# Patient Record
Sex: Female | Born: 1945 | Race: White | Hispanic: No | Marital: Married | Smoking: Never smoker
Health system: Southern US, Community
[De-identification: ages and names within clinical notes are randomized; demographics above are authoritative.]

## PROBLEM LIST (undated history)

## (undated) DIAGNOSIS — K219 Gastro-esophageal reflux disease without esophagitis: Secondary | ICD-10-CM

## (undated) DIAGNOSIS — R32 Unspecified urinary incontinence: Secondary | ICD-10-CM

## (undated) DIAGNOSIS — I10 Essential (primary) hypertension: Secondary | ICD-10-CM

## (undated) DIAGNOSIS — M1990 Unspecified osteoarthritis, unspecified site: Secondary | ICD-10-CM

## (undated) DIAGNOSIS — E785 Hyperlipidemia, unspecified: Secondary | ICD-10-CM

## (undated) DIAGNOSIS — A809 Acute poliomyelitis, unspecified: Secondary | ICD-10-CM

## (undated) HISTORY — PX: ABDOMINAL HYSTERECTOMY: SHX81

## (undated) HISTORY — PX: TONSILLECTOMY: SUR1361

---
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
## 2009-01-26 ENCOUNTER — Ambulatory Visit: Payer: Self-pay | Admitting: Family Medicine

## 2010-02-16 ENCOUNTER — Ambulatory Visit: Payer: Self-pay | Admitting: Surgery

## 2010-03-22 ENCOUNTER — Ambulatory Visit: Payer: Self-pay | Admitting: Gastroenterology

## 2011-03-21 ENCOUNTER — Ambulatory Visit: Payer: Self-pay | Admitting: Obstetrics and Gynecology

## 2011-06-27 HISTORY — PX: JOINT REPLACEMENT: SHX530

## 2012-01-15 ENCOUNTER — Ambulatory Visit: Payer: Self-pay | Admitting: Orthopedic Surgery

## 2012-01-15 DIAGNOSIS — I10 Essential (primary) hypertension: Secondary | ICD-10-CM

## 2012-01-15 LAB — CBC
HCT: 40 % (ref 35.0–47.0)
HGB: 12.8 g/dL (ref 12.0–16.0)
MCHC: 31.9 g/dL — ABNORMAL LOW (ref 32.0–36.0)
Platelet: 229 10*3/uL (ref 150–440)
RBC: 4.5 10*6/uL (ref 3.80–5.20)
RDW: 13.5 % (ref 11.5–14.5)

## 2012-01-15 LAB — BASIC METABOLIC PANEL
Anion Gap: 5 — ABNORMAL LOW (ref 7–16)
BUN: 17 mg/dL (ref 7–18)
Calcium, Total: 8.9 mg/dL (ref 8.5–10.1)
Co2: 29 mmol/L (ref 21–32)
Creatinine: 0.98 mg/dL (ref 0.60–1.30)
Glucose: 106 mg/dL — ABNORMAL HIGH (ref 65–99)
Osmolality: 280 (ref 275–301)
Potassium: 4.4 mmol/L (ref 3.5–5.1)
Sodium: 139 mmol/L (ref 136–145)

## 2012-01-15 LAB — SEDIMENTATION RATE: Erythrocyte Sed Rate: 17 mm/hr (ref 0–30)

## 2012-01-15 LAB — APTT: Activated PTT: 30 secs (ref 23.6–35.9)

## 2012-01-15 LAB — MRSA PCR SCREENING

## 2012-01-30 ENCOUNTER — Inpatient Hospital Stay: Payer: Self-pay | Admitting: Orthopedic Surgery

## 2012-01-31 LAB — BASIC METABOLIC PANEL
BUN: 5 mg/dL — ABNORMAL LOW (ref 7–18)
Calcium, Total: 8 mg/dL — ABNORMAL LOW (ref 8.5–10.1)
Chloride: 103 mmol/L (ref 98–107)
EGFR (Non-African Amer.): >60
Glucose: 145 mg/dL — ABNORMAL HIGH (ref 65–99)
Osmolality: 276 (ref 275–301)
Potassium: 3.9 mmol/L (ref 3.5–5.1)
Sodium: 138 mmol/L (ref 136–145)

## 2012-01-31 LAB — HEMOGLOBIN: HGB: 11 g/dL — ABNORMAL LOW (ref 12.0–16.0)

## 2012-02-01 LAB — HEMOGLOBIN: HGB: 10.5 g/dL — ABNORMAL LOW (ref 12.0–16.0)

## 2012-04-23 ENCOUNTER — Ambulatory Visit: Payer: Self-pay | Admitting: Family Medicine

## 2013-08-13 ENCOUNTER — Ambulatory Visit: Payer: Self-pay | Admitting: Family Medicine

## 2014-09-08 ENCOUNTER — Ambulatory Visit: Payer: Self-pay | Admitting: Family Medicine

## 2014-09-17 ENCOUNTER — Ambulatory Visit: Payer: Self-pay | Admitting: Family Medicine

## 2014-10-13 NOTE — Discharge Summary (Signed)
PATIENT NAME:  Lindsay Donaldson, Lindsay Donaldson MR#:  [id number] DATE OF BIRTH:  05-25-1946  DATE OF ADMISSION:  01/30/2012 DATE OF DISCHARGE:  02/02/2012   ADMITTING DIAGNOSIS: Left hip osteoarthritis.   DISCHARGE DIAGNOSIS: Left hip osteoarthritis.   PROCEDURE: Left total hip replacement, anterior approach.   SURGEON: Leitha Schuller, MD    ANESTHESIA: Spinal.   ESTIMATED BLOOD LOSS: 320 with 125 reinfused from cell saver.   COMPLICATIONS: None.   SPECIMENS: Femoral head.   IMPLANTS: Medacta Versa fit cup DM size 52 liner with a size M 28 mm head Versa fit cup, 52 mm head with a 28 mm internal diameter for the femoral head component and a size 1 standard Amis stem.   HISTORY: The patient is a 69 year old female who had left anterior groin hip pain over the last year. It has progressively gotten worse and has limited her activities of daily living. The patient has tried anti-inflammatory medications as well as pain medications with no relief. Pain has significantly gotten worse. She is having difficulty walking.   PHYSICAL EXAMINATION: GENERAL: Well developed, well nourished female in no apparent distress. Normal affect. Normal gait. Mild antalgic component left lower extremity. She does have a foot drop. HEENT: Head is normocephalic, atraumatic. Pupils equal, round, and reactive to light. HEART: Regular rate and rhythm. There is no murmur. There is normal apical pulse. LUNGS: Clear to auscultation. There is no wheeze, rhonchi, or crackles. There is normal expansion of bilateral chest walls. LEFT LOWER EXTREMITY: Examination of left lower extremity reveals no bony abnormality. No edema. No ecchymosis. The patient has 10 degrees internal rotation with significant pain and 20 degrees of external rotation with pain. The patient is nontender in the lateral greater trochanter region. The patient has anterior hip joint discomfort with internal and external rotation as well as flexion. NEUROLOGICAL: The patient has  a negative straight leg raise. She has 5 out of 5 muscle strength with hip flexion, extension, knee extension, knee flexion. The patient has sensation that is intact to light touch. VASCULAR: The patient has less than 2 second capillary refill. She has intact dorsalis pedis and posterior tibialis pulses.   HOSPITAL COURSE: The patient was admitted to the hospital on 01/30/2012. She had surgery that same day and she was brought to the orthopedic floor from the PAC-U in stable condition. The patient's blood work was monitored and on 01/31/2012 her hemoglobin was 11 and then on 02/01/2012 it had only dropped to 10.5. Throughout the patient's stay she had progressed well with physical therapy, her vital signs remained stable, and pain was under control. By 02/02/2012 the patient was stable and ready for discharge to home with home health.   DISCHARGE INSTRUCTIONS:  1. The patient may gradually increase weightbearing on the affected extremity.  2. Knee-high TED hose on both legs and remove at bedtime and replace on arising the next morning.  3. She may resume a regular diet. 4. Xarelto 10 mg orally once a day in the morning for 32 days and then oxycodone 5 to 10 mg every four hours as needed for pain.  5. She will apply ice to the affected area. Do not get the dressing or bandage wet or dirty. She needs to call [facility] if the dressing gets water under it. She needs to call [facility] also if any bright red bleeding from the incision wound or fever above 101.5 degrees, redness, swelling, or if drainage at the incision site occurs.  6.  She is referred to home physical therapy and has been arranged for continuation of the rehab program. [facility] if therapist has not contacted her within 48 hours.  7. She needs a follow-up appointment in two weeks. [facility] to schedule a an appointment.   DISCHARGE  MEDICATIONS:  1. Crestor 10 mg 1 tablet orally once a day at bedtime.  2. Multivitamin 1 tablet once in the morning.  3. MiraLAX 1 capful add  water daily as needed. 4. Calcium Citrate Plus D 1 tablet orally t.i.d.  5. Xyzal 5 mg oral tablet 1 tablet once a day in the evening. 6. Hydrochlorothiazide/triamterene 25 mg/37.5 mg oral capsule 0.5 capsule orally once a day in the morning.  7. Latanoprost 0.005% ophthalmic solution one drop to each affected eye once a day at bedtime.  8. Meloxicam 15 mg oral tablet 1 tablet orally once in the morning.  9. Tramadol 50 mg oral tablet 1 tab q.i.d. orally p.r.n.  10. Nexium 40 mg 1 tablet orally once a day in the morning. 11. Nifedipine 30 mg oral tablet extended-release 1 tablet orally once a day in the morning.   ____________________________ Evon Slackhomas C. Mykel Sponaugle, PA-C tcg:drc D: 02/02/2012 06:34:07 ET T: 02/02/2012 12:12:58 ET JOB#: [id number]  cc: Evon Slackhomas C. Hermina Barnard, PA-C, <Dictator> Evon SlackHOMAS C Rahul Malinak GeorgiaPA ELECTRONICALLY SIGNED 02/07/2012 12:04

## 2014-10-13 NOTE — Op Note (Signed)
PATIENT NAME:  Lindsay Donaldson, Lindsay Donaldson MR#:  [id number] DATE OF BIRTH:  September 13, 1945  DATE OF PROCEDURE:  01/30/2012  PREOPERATIVE DIAGNOSIS: Left hip osteoarthritis.   POSTOPERATIVE DIAGNOSIS: Left hip osteoarthritis.   PROCEDURE: Left total hip replacement, anterior approach.   SURGEON: Leitha SchullerMichael J. Reco Shonk, MD  ANESTHESIA: Spinal.   DESCRIPTION OF PROCEDURE: Patient brought to the Operating Room and after adequate spinal anesthesia was obtained the patient was placed on the operative table with the right leg in a well legholder and left leg in the Medacta table attachment with the traction boot applied. After appropriate positioning C-arm was brought in and good visualization of the proximal femur could be obtained and preprocedure picture printed for intraoperative templating. The hip was then prepped and draped using standard technique and after appropriate patient identification and timeout procedures were carried out anterior hip approach was performed first with a skin incision centered over the greater trochanter running obliquely laterally as it went distal getting down to the tensor fascia muscle. The fascia overlying this was incised and going within this muscle compartment the muscle was retracted laterally and Adson-Beckman retractor inserted. The deep fascia was incised and the quadriceps fascia was then identified and incised. The quadriceps muscle retracted medially and in this interval the circumflex femoral branches artery and vein were identified and tied off with minimal bleeding encountered. Next, the anterior capsule was well visualized and exposed and anterior capsulotomy carried out. The femoral neck cut was made with assistance of the C-arm to assess the level. After the femoral neck cut was made the head was removed with use of a corkscrew. There was eburnated bone on the superior aspect of the femoral head with significant wear. The acetabulum was freed of debris and labrum. The 50 mm was  used initially followed by the 52 and this seemed to give good fit to the cup with exposed bone in the center and periphery of the acetabulum. The trial cup was tight with the pullout test and the final component was then impacted, appeared to be in appropriate position. The leg was then dropped down into extension with external rotation and some adduction. A portion of the capsule was released to allow for this. The femur was approached with a box osteotome then the starter reamer then sequential rasping. The #1 seemed to give a tight fit and with trialing this gave good appropriate position with x-ray being taken and printed and comparing to the initial image. The final components were then taken and inserted with the #1 femoral stem followed by a medium 28 mm head with dual mobility 52 mm liner. The hip was then reduced without difficulty. The hip appeared stable. The wound was thoroughly irrigated and then injected with a combination of Toradol, morphine and local anesthetic, 0.25% this Sensorcaine with epinephrine. The wound was then closed with running heavy quill suture for the fascia lata, 2-0 quill with subcutaneous drain, and skin staples. 4 x 4's, ABD and tape applied and the patient sent to recovery room in stable condition.   ESTIMATED BLOOD LOSS: 325 with 125 reinfused from Cell Saver.   COMPLICATIONS: None.   SPECIMENS: Femoral head.   IMPLANTS: Medacta Versafit cup DM size 52 liner with a size M 28 mm head Versafit cup, 52 mm head with a 28 mm internal diameter for the femoral head component and a size 1 standard Amis stem.   ____________________________ Leitha SchullerMichael J. Zakiah Beckerman, MD mjm:cms D: 01/30/2012 17:12:33 ET T: 01/31/2012 09:17:22 ET JOB#: [id number]  cc: Leitha Schuller, MD, <Dictator> Nolon Bussing [facility] MD ELECTRONICALLY SIGNED 01/31/2012 12:47

## 2015-10-28 ENCOUNTER — Other Ambulatory Visit: Payer: Self-pay | Admitting: Family Medicine

## 2015-10-28 DIAGNOSIS — Z1239 Encounter for other screening for malignant neoplasm of breast: Secondary | ICD-10-CM

## 2015-12-02 ENCOUNTER — Ambulatory Visit: Payer: Self-pay

## 2015-12-14 ENCOUNTER — Ambulatory Visit
Admission: RE | Admit: 2015-12-14 | Discharge: 2015-12-14 | Disposition: A | Discharge status: Home / Self Care | Payer: Medicare Other | Source: Ambulatory Visit | Attending: Family Medicine | Admitting: Family Medicine

## 2015-12-14 ENCOUNTER — Other Ambulatory Visit: Payer: Self-pay | Admitting: Family Medicine

## 2015-12-14 DIAGNOSIS — Z1239 Encounter for other screening for malignant neoplasm of breast: Secondary | ICD-10-CM

## 2015-12-14 DIAGNOSIS — Z1231 Encounter for screening mammogram for malignant neoplasm of breast: Secondary | ICD-10-CM | POA: Insufficient documentation

## 2016-11-01 ENCOUNTER — Other Ambulatory Visit: Payer: Self-pay | Admitting: Family Medicine

## 2016-11-01 DIAGNOSIS — Z1231 Encounter for screening mammogram for malignant neoplasm of breast: Secondary | ICD-10-CM

## 2016-12-13 ENCOUNTER — Other Ambulatory Visit: Payer: Self-pay | Admitting: Orthopedic Surgery

## 2016-12-13 DIAGNOSIS — M4726 Other spondylosis with radiculopathy, lumbar region: Secondary | ICD-10-CM

## 2016-12-13 DIAGNOSIS — M5416 Radiculopathy, lumbar region: Secondary | ICD-10-CM

## 2016-12-13 DIAGNOSIS — M5136 Other intervertebral disc degeneration, lumbar region: Secondary | ICD-10-CM

## 2016-12-15 ENCOUNTER — Ambulatory Visit
Admission: RE | Admit: 2016-12-15 | Discharge: 2016-12-15 | Disposition: A | Discharge status: Home / Self Care | Payer: Medicare Other | Source: Ambulatory Visit | Attending: Family Medicine | Admitting: Family Medicine

## 2016-12-15 DIAGNOSIS — Z1231 Encounter for screening mammogram for malignant neoplasm of breast: Secondary | ICD-10-CM | POA: Diagnosis not present

## 2016-12-19 ENCOUNTER — Ambulatory Visit
Admission: RE | Admit: 2016-12-19 | Discharge: 2016-12-19 | Disposition: A | Discharge status: Home / Self Care | Payer: Medicare Other | Source: Ambulatory Visit | Attending: Orthopedic Surgery | Admitting: Orthopedic Surgery

## 2016-12-19 DIAGNOSIS — M48061 Spinal stenosis, lumbar region without neurogenic claudication: Secondary | ICD-10-CM | POA: Diagnosis not present

## 2016-12-19 DIAGNOSIS — M4186 Other forms of scoliosis, lumbar region: Secondary | ICD-10-CM | POA: Insufficient documentation

## 2016-12-19 DIAGNOSIS — M5116 Intervertebral disc disorders with radiculopathy, lumbar region: Secondary | ICD-10-CM | POA: Diagnosis not present

## 2016-12-19 DIAGNOSIS — M5136 Other intervertebral disc degeneration, lumbar region: Secondary | ICD-10-CM | POA: Insufficient documentation

## 2016-12-19 DIAGNOSIS — M5416 Radiculopathy, lumbar region: Secondary | ICD-10-CM | POA: Diagnosis present

## 2016-12-19 DIAGNOSIS — M4726 Other spondylosis with radiculopathy, lumbar region: Secondary | ICD-10-CM | POA: Diagnosis not present

## 2017-06-21 ENCOUNTER — Ambulatory Visit
Admission: RE | Admit: 2017-06-21 | Discharge: 2017-06-21 | Disposition: A | Discharge status: Home / Self Care | Payer: Medicare Other | Source: Ambulatory Visit | Attending: Neurosurgery | Admitting: Neurosurgery

## 2017-06-21 ENCOUNTER — Other Ambulatory Visit: Payer: Self-pay | Admitting: Neurosurgery

## 2017-06-21 DIAGNOSIS — M419 Scoliosis, unspecified: Secondary | ICD-10-CM

## 2017-06-21 DIAGNOSIS — M48062 Spinal stenosis, lumbar region with neurogenic claudication: Secondary | ICD-10-CM | POA: Diagnosis not present

## 2017-06-21 DIAGNOSIS — M4316 Spondylolisthesis, lumbar region: Secondary | ICD-10-CM | POA: Insufficient documentation

## 2017-06-21 DIAGNOSIS — M5136 Other intervertebral disc degeneration, lumbar region: Secondary | ICD-10-CM | POA: Diagnosis not present

## 2017-08-13 ENCOUNTER — Encounter
Admission: RE | Admit: 2017-08-13 | Discharge: 2017-08-13 | Disposition: A | Discharge status: Home / Self Care | Payer: Medicare Other | Source: Ambulatory Visit | Attending: Neurosurgery | Admitting: Neurosurgery

## 2017-08-13 ENCOUNTER — Other Ambulatory Visit: Payer: Self-pay

## 2017-08-13 DIAGNOSIS — Z01810 Encounter for preprocedural cardiovascular examination: Secondary | ICD-10-CM | POA: Insufficient documentation

## 2017-08-13 DIAGNOSIS — Z01812 Encounter for preprocedural laboratory examination: Secondary | ICD-10-CM | POA: Diagnosis not present

## 2017-08-13 HISTORY — DX: Hyperlipidemia, unspecified: E78.5

## 2017-08-13 HISTORY — DX: Gastro-esophageal reflux disease without esophagitis: K21.9

## 2017-08-13 HISTORY — DX: Unspecified urinary incontinence: R32

## 2017-08-13 HISTORY — DX: Essential (primary) hypertension: I10

## 2017-08-13 HISTORY — DX: Unspecified osteoarthritis, unspecified site: M19.90

## 2017-08-13 HISTORY — DX: Acute poliomyelitis, unspecified: A80.9

## 2017-08-13 LAB — BASIC METABOLIC PANEL
ANION GAP: 10 (ref 5–15)
BUN: 19 mg/dL (ref 6–20)
CO2: 29 mmol/L (ref 22–32)
Calcium: 10 mg/dL (ref 8.9–10.3)
Chloride: 99 mmol/L — ABNORMAL LOW (ref 101–111)
Creatinine, Ser: 0.88 mg/dL (ref 0.44–1.00)
GFR calc non Af Amer: >60 mL/min (ref >60)
GLUCOSE: 99 mg/dL (ref 65–99)
POTASSIUM: 4.2 mmol/L (ref 3.5–5.1)
Sodium: 138 mmol/L (ref 135–145)

## 2017-08-13 LAB — URINALYSIS, ROUTINE W REFLEX MICROSCOPIC
BILIRUBIN URINE: NEGATIVE
Bacteria, UA: NONE SEEN
Glucose, UA: NEGATIVE mg/dL
Hgb urine dipstick: NEGATIVE
Ketones, ur: NEGATIVE mg/dL
Nitrite: NEGATIVE
Protein, ur: NEGATIVE mg/dL
SPECIFIC GRAVITY, URINE: 1.013 (ref 1.005–1.030)
pH: 6 (ref 5.0–8.0)

## 2017-08-13 LAB — DIFFERENTIAL
Basophils Absolute: 0 10*3/uL (ref 0–0.1)
Basophils Relative: 1 %
EOS ABS: 0.1 10*3/uL (ref 0–0.7)
Eosinophils Relative: 2 %
LYMPHS ABS: 0.8 10*3/uL — AB (ref 1.0–3.6)
LYMPHS PCT: 15 %
MONOS PCT: 9 %
Monocytes Absolute: 0.5 10*3/uL (ref 0.2–0.9)
NEUTROS ABS: 4.2 10*3/uL (ref 1.4–6.5)
Neutrophils Relative %: 73 %

## 2017-08-13 LAB — CBC
HEMATOCRIT: 41.2 % (ref 35.0–47.0)
HEMOGLOBIN: 13.8 g/dL (ref 12.0–16.0)
MCH: 30.1 pg (ref 26.0–34.0)
MCHC: 33.6 g/dL (ref 32.0–36.0)
MCV: 89.5 fL (ref 80.0–100.0)
Platelets: 238 10*3/uL (ref 150–440)
RBC: 4.6 MIL/uL (ref 3.80–5.20)
RDW: 13.4 % (ref 11.5–14.5)
WBC: 5.6 10*3/uL (ref 3.6–11.0)

## 2017-08-13 LAB — SURGICAL PCR SCREEN
MRSA, PCR: NEGATIVE
Staphylococcus aureus: POSITIVE — AB

## 2017-08-13 LAB — APTT: APTT: 31 s (ref 24–36)

## 2017-08-13 NOTE — Patient Instructions (Signed)
Your procedure is scheduled on: Wed. 08/22/17 Report to Day Surgery. To find out your arrival time please call [phone] between 1PM - 3PM on Tues 08/21/17.  Remember: Instructions that are not followed completely may result in serious medical risk, up to and including death, or upon the discretion of your surgeon and anesthesiologist your surgery may need to be rescheduled.     _X__ 1. Do not eat food after midnight the night before your procedure.                 No gum chewing or hard candies. You may drink clear liquids up to 2 hours                 before you are scheduled to arrive for your surgery- DO not drink clear                 liquids within 2 hours of the start of your surgery.                 Clear Liquids include:  water, apple juice without pulp, clear carbohydrate                 drink such as Clearfast of Gartorade, Black Coffee or Tea (Do not add                 anything to coffee or tea).  __X__2.  On the morning of surgery brush your teeth with toothpaste and water, you may rinse your mouth with mouthwash if you wish.  Do not swallow any  toothpaste of mouthwash.     ___ 3.  No Alcohol for 24 hours before or after surgery.   ___ 4.  Do Not Smoke or use e-cigarettes For 24 Hours Prior to Your Surgery.                 Do not use any chewable tobacco products for at least 6 hours prior to                 surgery.  ____  5.  Bring all medications with you on the day of surgery if instructed.   __x__  6.  Notify your doctor if there is any change in your medical condition      (cold, fever, infections).     Do not wear jewelry, make-up, hairpins, clips or nail polish. Do not wear lotions, powders, or perfumes. You may wear deodorant. Do not shave 48 hours prior to surgery. Men may shave face and neck. Do not bring valuables to the hospital.    [facility] is not responsible for any belongings or valuables.  Contacts, dentures or bridgework  may not be worn into surgery. Leave your suitcase in the car. After surgery it may be brought to your room. For patients admitted to the hospital, discharge time is determined by your treatment team.   Patients discharged the day of surgery will not be allowed to drive home.   Please read over the following fact sheets that you were given:   _x___ Take these medicines the morning of surgery with A SIP OF WATER:    1. amLODipine (NORVASC) 5 MG tablet  2. dorzolamide (TRUSOPT) 2 % ophthalmic solution  3. metoprolol tartrate (LOPRESSOR) 50 MG tablet  4.  5.  6.  ____ Fleet Enema (as directed)   __x__ Use CHG Soap as directed  ____ Use inhalers on the day of surgery  ____  Stop metformin 2 days prior to surgery    ____ Take 1/2 of usual insulin dose the night before surgery. No insulin the morning          of surgery.   __x__ Stop aspirin on 2/20  __x__ Stop Anti-inflammatories on diclofenac sodium (VOLTAREN) 1 % GEL 08/17/17 May take tylenol   ____ Stop supplements until after surgery.    ____ Bring C-Pap to the hospital.

## 2017-08-22 ENCOUNTER — Ambulatory Visit: Payer: Medicare Other

## 2017-08-22 ENCOUNTER — Ambulatory Visit: Payer: Medicare Other | Admitting: Certified Registered Nurse Anesthetist

## 2017-08-22 ENCOUNTER — Encounter
Admission: RE | Disposition: A | Discharge status: Home / Self Care | Payer: Self-pay | Source: Ambulatory Visit | Attending: Neurosurgery

## 2017-08-22 ENCOUNTER — Other Ambulatory Visit: Payer: Self-pay

## 2017-08-22 ENCOUNTER — Ambulatory Visit
Admission: RE | Admit: 2017-08-22 | Discharge: 2017-08-22 | Disposition: A | Discharge status: Home / Self Care | Payer: Medicare Other | Source: Ambulatory Visit | Attending: Neurosurgery | Admitting: Neurosurgery

## 2017-08-22 DIAGNOSIS — Z79899 Other long term (current) drug therapy: Secondary | ICD-10-CM | POA: Diagnosis not present

## 2017-08-22 DIAGNOSIS — M48062 Spinal stenosis, lumbar region with neurogenic claudication: Secondary | ICD-10-CM | POA: Insufficient documentation

## 2017-08-22 DIAGNOSIS — M1990 Unspecified osteoarthritis, unspecified site: Secondary | ICD-10-CM | POA: Insufficient documentation

## 2017-08-22 DIAGNOSIS — Z96642 Presence of left artificial hip joint: Secondary | ICD-10-CM | POA: Insufficient documentation

## 2017-08-22 DIAGNOSIS — Z7982 Long term (current) use of aspirin: Secondary | ICD-10-CM | POA: Diagnosis not present

## 2017-08-22 DIAGNOSIS — I10 Essential (primary) hypertension: Secondary | ICD-10-CM | POA: Diagnosis not present

## 2017-08-22 DIAGNOSIS — E785 Hyperlipidemia, unspecified: Secondary | ICD-10-CM | POA: Insufficient documentation

## 2017-08-22 DIAGNOSIS — M21372 Foot drop, left foot: Secondary | ICD-10-CM | POA: Insufficient documentation

## 2017-08-22 DIAGNOSIS — Z833 Family history of diabetes mellitus: Secondary | ICD-10-CM | POA: Diagnosis not present

## 2017-08-22 DIAGNOSIS — H409 Unspecified glaucoma: Secondary | ICD-10-CM | POA: Insufficient documentation

## 2017-08-22 DIAGNOSIS — Z8249 Family history of ischemic heart disease and other diseases of the circulatory system: Secondary | ICD-10-CM | POA: Insufficient documentation

## 2017-08-22 DIAGNOSIS — Z886 Allergy status to analgesic agent status: Secondary | ICD-10-CM | POA: Insufficient documentation

## 2017-08-22 DIAGNOSIS — Z791 Long term (current) use of non-steroidal anti-inflammatories (NSAID): Secondary | ICD-10-CM | POA: Insufficient documentation

## 2017-08-22 DIAGNOSIS — Z419 Encounter for procedure for purposes other than remedying health state, unspecified: Secondary | ICD-10-CM

## 2017-08-22 DIAGNOSIS — Z8612 Personal history of poliomyelitis: Secondary | ICD-10-CM | POA: Insufficient documentation

## 2017-08-22 DIAGNOSIS — K219 Gastro-esophageal reflux disease without esophagitis: Secondary | ICD-10-CM | POA: Diagnosis not present

## 2017-08-22 HISTORY — PX: LUMBAR LAMINECTOMY/DECOMPRESSION MICRODISCECTOMY: SHX5026

## 2017-08-22 LAB — TYPE AND SCREEN
ABO/RH(D): A NEG
Antibody Screen: NEGATIVE

## 2017-08-22 LAB — ABO/RH: ABO/RH(D): A NEG

## 2017-08-22 SURGERY — LUMBAR LAMINECTOMY/DECOMPRESSION MICRODISCECTOMY 2 LEVELS
Anesthesia: General | Site: Spine Lumbar | Laterality: Bilateral | Wound class: Clean

## 2017-08-22 MED ORDER — BUPIVACAINE-EPINEPHRINE (PF) 0.5% -1:200000 IJ SOLN
INTRAMUSCULAR | Status: DC | PRN
  Administered 2017-08-22: 8 mL

## 2017-08-22 MED ORDER — ONDANSETRON HCL 4 MG/2ML IJ SOLN
INTRAMUSCULAR | Status: AC
  Filled 2017-08-22: qty 2

## 2017-08-22 MED ORDER — FENTANYL CITRATE (PF) 250 MCG/5ML IJ SOLN
INTRAMUSCULAR | Status: AC
  Filled 2017-08-22: qty 5

## 2017-08-22 MED ORDER — ACETAMINOPHEN 10 MG/ML IV SOLN
INTRAVENOUS | Status: AC
  Filled 2017-08-22: qty 100

## 2017-08-22 MED ORDER — CEFAZOLIN SODIUM-DEXTROSE 2-4 GM/100ML-% IV SOLN
2.0000 g | INTRAVENOUS | Status: AC
  Administered 2017-08-22: 2 g via INTRAVENOUS

## 2017-08-22 MED ORDER — CEFAZOLIN SODIUM-DEXTROSE 2-4 GM/100ML-% IV SOLN
INTRAVENOUS | Status: AC
  Filled 2017-08-22: qty 100

## 2017-08-22 MED ORDER — METHYLPREDNISOLONE ACETATE 40 MG/ML IJ SUSP
INTRAMUSCULAR | Status: DC | PRN
  Administered 2017-08-22: 40 mg

## 2017-08-22 MED ORDER — PHENYLEPHRINE HCL 10 MG/ML IJ SOLN
INTRAMUSCULAR | Status: DC | PRN
  Administered 2017-08-22 (×4): 100 ug via INTRAVENOUS

## 2017-08-22 MED ORDER — BUPIVACAINE HCL (PF) 0.5 % IJ SOLN
INTRAMUSCULAR | Status: DC | PRN
  Administered 2017-08-22: 20 mL

## 2017-08-22 MED ORDER — DEXAMETHASONE SODIUM PHOSPHATE 10 MG/ML IJ SOLN
INTRAMUSCULAR | Status: DC | PRN
  Administered 2017-08-22: 10 mg via INTRAVENOUS

## 2017-08-22 MED ORDER — FENTANYL CITRATE (PF) 100 MCG/2ML IJ SOLN: 25.0000 ug | INTRAMUSCULAR | Status: DC | PRN

## 2017-08-22 MED ORDER — LIDOCAINE HCL (PF) 2 % IJ SOLN
INTRAMUSCULAR | Status: AC
  Filled 2017-08-22: qty 10

## 2017-08-22 MED ORDER — ROCURONIUM BROMIDE 50 MG/5ML IV SOLN
INTRAVENOUS | Status: AC
  Filled 2017-08-22: qty 1

## 2017-08-22 MED ORDER — FENTANYL CITRATE (PF) 100 MCG/2ML IJ SOLN
INTRAMUSCULAR | Status: DC | PRN
  Administered 2017-08-22 (×2): 100 ug via INTRAVENOUS

## 2017-08-22 MED ORDER — SODIUM CHLORIDE 0.9 % IV SOLN
INTRAVENOUS | Status: DC | PRN
  Administered 2017-08-22: 40 mL

## 2017-08-22 MED ORDER — SUCCINYLCHOLINE CHLORIDE 20 MG/ML IJ SOLN
INTRAMUSCULAR | Status: AC
  Filled 2017-08-22: qty 1

## 2017-08-22 MED ORDER — ONDANSETRON HCL 4 MG/2ML IJ SOLN: 4.0000 mg | Freq: Once | INTRAMUSCULAR | Status: DC | PRN

## 2017-08-22 MED ORDER — SODIUM CHLORIDE FLUSH 0.9 % IV SOLN
INTRAVENOUS | Status: AC
  Filled 2017-08-22: qty 10

## 2017-08-22 MED ORDER — EPHEDRINE SULFATE 50 MG/ML IJ SOLN
INTRAMUSCULAR | Status: DC | PRN
  Administered 2017-08-22 (×2): 10 mg via INTRAVENOUS
  Administered 2017-08-22: 20 mg via INTRAVENOUS

## 2017-08-22 MED ORDER — ROCURONIUM BROMIDE 100 MG/10ML IV SOLN
INTRAVENOUS | Status: DC | PRN
  Administered 2017-08-22: 10 mg via INTRAVENOUS

## 2017-08-22 MED ORDER — ACETAMINOPHEN 10 MG/ML IV SOLN
INTRAVENOUS | Status: DC | PRN
  Administered 2017-08-22: 1000 mg via INTRAVENOUS

## 2017-08-22 MED ORDER — LIDOCAINE HCL (CARDIAC) 20 MG/ML IV SOLN
INTRAVENOUS | Status: DC | PRN
  Administered 2017-08-22: 80 mg via INTRAVENOUS

## 2017-08-22 MED ORDER — SODIUM CHLORIDE 0.9 % IV SOLN
INTRAVENOUS | Status: DC | PRN
  Administered 2017-08-22: 20 ug/min via INTRAVENOUS

## 2017-08-22 MED ORDER — PROPOFOL 10 MG/ML IV BOLUS
INTRAVENOUS | Status: DC | PRN
  Administered 2017-08-22: 150 mg via INTRAVENOUS

## 2017-08-22 MED ORDER — PROPOFOL 10 MG/ML IV BOLUS
INTRAVENOUS | Status: AC
  Filled 2017-08-22: qty 20

## 2017-08-22 MED ORDER — KETAMINE HCL 10 MG/ML IJ SOLN
INTRAMUSCULAR | Status: DC | PRN
  Administered 2017-08-22: 50 mg via INTRAVENOUS

## 2017-08-22 MED ORDER — BACITRACIN 50000 UNITS IM SOLR
INTRAMUSCULAR | Status: DC | PRN
  Administered 2017-08-22: 50000 [IU]

## 2017-08-22 MED ORDER — THROMBIN (RECOMBINANT) 5000 UNITS EX SOLR
CUTANEOUS | Status: DC | PRN
  Administered 2017-08-22: 5000 [IU] via TOPICAL

## 2017-08-22 MED ORDER — FAMOTIDINE 20 MG PO TABS
20.0000 mg | ORAL_TABLET | Freq: Once | ORAL | Status: AC
  Administered 2017-08-22: 20 mg via ORAL

## 2017-08-22 MED ORDER — ONDANSETRON HCL 4 MG/2ML IJ SOLN
INTRAMUSCULAR | Status: DC | PRN
  Administered 2017-08-22: 4 mg via INTRAVENOUS

## 2017-08-22 MED ORDER — FAMOTIDINE 20 MG PO TABS
ORAL_TABLET | ORAL | Status: AC
  Administered 2017-08-22: 20 mg via ORAL
  Filled 2017-08-22: qty 1

## 2017-08-22 MED ORDER — METHOCARBAMOL 500 MG PO TABS: 500.0000 mg | ORAL_TABLET | Freq: Four times a day (QID) | ORAL | 0 refills | Status: AC | PRN

## 2017-08-22 MED ORDER — SUCCINYLCHOLINE CHLORIDE 20 MG/ML IJ SOLN
INTRAMUSCULAR | Status: DC | PRN
  Administered 2017-08-22: 100 mg via INTRAVENOUS

## 2017-08-22 MED ORDER — OXYCODONE HCL 5 MG PO TABS: 5.0000 mg | ORAL_TABLET | ORAL | 0 refills | Status: AC | PRN

## 2017-08-22 MED ORDER — DEXAMETHASONE SODIUM PHOSPHATE 10 MG/ML IJ SOLN
INTRAMUSCULAR | Status: AC
  Filled 2017-08-22: qty 1

## 2017-08-22 MED ORDER — LACTATED RINGERS IV SOLN
INTRAVENOUS | Status: DC
  Administered 2017-08-22: 10:00:00 via INTRAVENOUS

## 2017-08-22 SURGICAL SUPPLY — 66 items
BLADE BOVIE TIP EXT 4 (BLADE) ×3 IMPLANT
BUR NEURO DRILL SOFT 3.0X3.8M (BURR) ×3 IMPLANT
CANISTER SUCT 1200ML W/VALVE (MISCELLANEOUS) ×6 IMPLANT
CHLORAPREP W/TINT 26ML (MISCELLANEOUS) ×6 IMPLANT
CNTNR SPEC 2.5X3XGRAD LEK (MISCELLANEOUS) ×1
CONT SPEC 4OZ STER OR WHT (MISCELLANEOUS) ×2
CONTAINER SPEC 2.5X3XGRAD LEK (MISCELLANEOUS) ×1 IMPLANT
COUNTER NEEDLE 20/40 LG (NEEDLE) ×3 IMPLANT
COVER LIGHT HANDLE STERIS (MISCELLANEOUS) ×6 IMPLANT
CUP MEDICINE 2OZ PLAST GRAD ST (MISCELLANEOUS) ×6 IMPLANT
DERMABOND ADVANCED (GAUZE/BANDAGES/DRESSINGS) ×2
DERMABOND ADVANCED .7 DNX12 (GAUZE/BANDAGES/DRESSINGS) ×1 IMPLANT
DRAPE C-ARM 42X72 X-RAY (DRAPES) ×6 IMPLANT
DRAPE LAPAROTOMY 100X77 ABD (DRAPES) ×3 IMPLANT
DRAPE MICROSCOPE SPINE 48X150 (DRAPES) ×3 IMPLANT
DRAPE POUCH INSTRU U-SHP 10X18 (DRAPES) ×3 IMPLANT
DRAPE SURG 17X11 SM STRL (DRAPES) ×12 IMPLANT
DRSG TEGADERM 4X4.75 (GAUZE/BANDAGES/DRESSINGS) ×3 IMPLANT
DRSG TELFA 4X3 1S NADH ST (GAUZE/BANDAGES/DRESSINGS) IMPLANT
ELECT CAUTERY BLADE TIP 2.5 (TIP) ×3
ELECT EZSTD 165MM 6.5IN (MISCELLANEOUS) ×3
ELECTRODE CAUTERY BLDE TIP 2.5 (TIP) ×1 IMPLANT
ELECTRODE EZSTD 165MM 6.5IN (MISCELLANEOUS) ×1 IMPLANT
EVICEL AIRLESS SPRAY ACCES (MISCELLANEOUS) IMPLANT
FRAME EYE SHIELD (PROTECTIVE WEAR) ×3 IMPLANT
GLOVE BIO SURGEON STRL SZ 6.5 (GLOVE) ×4 IMPLANT
GLOVE BIO SURGEONS STRL SZ 6.5 (GLOVE) ×2
GLOVE BIOGEL PI IND STRL 7.0 (GLOVE) ×2 IMPLANT
GLOVE BIOGEL PI INDICATOR 7.0 (GLOVE) ×4
GLOVE SURG SYN 8.5  E (GLOVE) ×6
GLOVE SURG SYN 8.5 E (GLOVE) ×3 IMPLANT
GOWN SRG XL LVL 3 NONREINFORCE (GOWNS) ×1 IMPLANT
GOWN STRL NON-REIN TWL XL LVL3 (GOWNS) ×2
GOWN STRL REUS W/ TWL LRG LVL3 (GOWN DISPOSABLE) ×1 IMPLANT
GOWN STRL REUS W/TWL LRG LVL3 (GOWN DISPOSABLE) ×2
GRADUATE 1200CC STRL 31836 (MISCELLANEOUS) ×3 IMPLANT
IV CATH ANGIO 12GX3 LT BLUE (NEEDLE) ×3 IMPLANT
KIT SPINAL PRONEVIEW (KITS) ×3 IMPLANT
KNIFE BAYONET SHORT DISCETOMY (MISCELLANEOUS) IMPLANT
MARKER SKIN DUAL TIP RULER LAB (MISCELLANEOUS) ×9 IMPLANT
NDL SAFETY ECLIPSE 18X1.5 (NEEDLE) ×1 IMPLANT
NEEDLE HYPO 18GX1.5 SHARP (NEEDLE) ×2
NEEDLE HYPO 22GX1.5 SAFETY (NEEDLE) ×3 IMPLANT
NS IRRIG 1000ML POUR BTL (IV SOLUTION) ×3 IMPLANT
PACK LAMINECTOMY NEURO (CUSTOM PROCEDURE TRAY) ×3 IMPLANT
PAD ARMBOARD 7.5X6 YLW CONV (MISCELLANEOUS) ×3 IMPLANT
PATTIES SURGICAL .5X1.5 (GAUZE/BANDAGES/DRESSINGS) IMPLANT
SPOGE SURGIFLO 8M (HEMOSTASIS) ×4
SPONGE SURGIFLO 8M (HEMOSTASIS) ×2 IMPLANT
STAPLER SKIN PROX 35W (STAPLE) IMPLANT
SUT DVC VLOC 3-0 CL 6 P-12 (SUTURE) ×3 IMPLANT
SUT NURALON 4 0 TR CR/8 (SUTURE) IMPLANT
SUT VIC AB 0 CT1 27 (SUTURE)
SUT VIC AB 0 CT1 27XCR 8 STRN (SUTURE) IMPLANT
SUT VIC AB 2-0 CT1 18 (SUTURE) ×3 IMPLANT
SUT VICRYL 0 AB UR-6 (SUTURE) ×3 IMPLANT
SUT VICRYL 0 UR6 27IN ABS (SUTURE) IMPLANT
SYR 10ML LL (SYRINGE) ×3 IMPLANT
SYR 20CC LL (SYRINGE) ×3 IMPLANT
SYR 30ML LL (SYRINGE) ×6 IMPLANT
SYR 3ML LL SCALE MARK (SYRINGE) ×3 IMPLANT
TOWEL OR 17X26 4PK STRL BLUE (TOWEL DISPOSABLE) ×12 IMPLANT
TUBE MATRX SPINL 18MM 7CM DISP (INSTRUMENTS) ×2
TUBE METRX SPINAL 18X7 DISP (INSTRUMENTS) ×1 IMPLANT
TUBING CONNECTING 10 (TUBING) ×2 IMPLANT
TUBING CONNECTING 10' (TUBING) ×1

## 2017-08-22 NOTE — H&P (Signed)
I have reviewed and confirmed my history and physical from 08/02/17 with no additions or changes. Plan for L4-S1 decompression.  Risks and benefits reviewed.    Heart sounds normal no MRG. Chest Clear to Auscultation Bilaterally.

## 2017-08-22 NOTE — Progress Notes (Signed)
Pt has no urge to void   Dr. Marcell BarlowYarborough called and states pt may be discharged

## 2017-08-22 NOTE — Progress Notes (Signed)
Walked short distance without difficulty  No back pain

## 2017-08-22 NOTE — Progress Notes (Signed)
Up to chair at bedside  Tolerated well without difficulty

## 2017-08-22 NOTE — Anesthesia Procedure Notes (Signed)
Procedure Name: Intubation Date/Time: 08/22/2017 10:33 AM Performed by: Eben Burow, CRNA Pre-anesthesia Checklist: Patient identified, Suction available, Emergency Drugs available, Patient being monitored and Timeout performed Patient Re-evaluated:Patient Re-evaluated prior to induction Oxygen Delivery Method: Circle system utilized Preoxygenation: Pre-oxygenation with 100% oxygen Induction Type: IV induction Ventilation: Mask ventilation without difficulty Laryngoscope Size: McGraph and 3 Grade View: Grade I Tube type: Oral Tube size: 7.0 mm Number of attempts: 1 Airway Equipment and Method: Stylet,  LTA kit utilized and Video-laryngoscopy Placement Confirmation: ETT inserted through vocal cords under direct vision,  positive ETCO2 and breath sounds checked- equal and bilateral Secured at: 21 cm Tube secured with: Tape Dental Injury: Teeth and Oropharynx as per pre-operative assessment

## 2017-08-22 NOTE — OR Nursing (Signed)
Two ring on left ring finer, patient unable to removed, tape applied OR nurse aware.  Dr Myer HaffYarbrough notified that there is not a reslut for PT, no new orders at this time.

## 2017-08-22 NOTE — Anesthesia Post-op Follow-up Note (Signed)
Anesthesia QCDR form completed.        

## 2017-08-22 NOTE — Anesthesia Preprocedure Evaluation (Signed)
Anesthesia Evaluation  Patient identified by MRN, date of birth, ID band Patient awake    Reviewed: Allergy & Precautions, H&P , NPO status , Patient's Chart, lab work & pertinent test results, reviewed documented beta blocker date and time   Airway Mallampati: IV  TM Distance: <3 FB Neck ROM: full  Mouth opening: Limited Mouth Opening  Dental  (+) Teeth Intact   Pulmonary neg pulmonary ROS,    Pulmonary exam normal        Cardiovascular Exercise Tolerance: Poor hypertension, On Medications negative cardio ROS Normal cardiovascular exam Rhythm:regular Rate:Normal     Neuro/Psych negative neurological ROS  negative psych ROS   GI/Hepatic negative GI ROS, Neg liver ROS, GERD  Medicated,  Endo/Other  negative endocrine ROS  Renal/GU negative Renal ROS  negative genitourinary   Musculoskeletal   Abdominal   Peds  Hematology negative hematology ROS (+)   Anesthesia Other Findings Past Medical History: No date: Arthritis No date: GERD (gastroesophageal reflux disease)     Comment:  history No date: Hyperlipidemia No date: Hypertension 1959: Polio     Comment:  wears left leg brace No date: Urinary incontinence in female Past Surgical History: No date: ABDOMINAL HYSTERECTOMY 2013: JOINT REPLACEMENT; Left     Comment:  left hip No date: TONSILLECTOMY BMI    Body Mass Index:  29.95 kg/m     Reproductive/Obstetrics negative OB ROS                             Anesthesia Physical Anesthesia Plan  ASA: III  Anesthesia Plan: General ETT   Post-op Pain Management:    Induction:   PONV Risk Score and Plan:   Airway Management Planned: Video Laryngoscope Planned  Additional Equipment:   Intra-op Plan:   Post-operative Plan:   Informed Consent: I have reviewed the patients History and Physical, chart, labs and discussed the procedure including the risks, benefits and  alternatives for the proposed anesthesia with the patient or authorized representative who has indicated his/her understanding and acceptance.   Dental Advisory Given  Plan Discussed with: CRNA  Anesthesia Plan Comments:         Anesthesia Quick Evaluation

## 2017-08-22 NOTE — Progress Notes (Signed)
Pharmacy Antibiotic Note  Lindsay Donaldson is a 72 y.o. female admitted on (Not on file) with Surgical prophylaxis.  Pharmacy has been consulted for cefazolin dosing.  Plan: Will give cefazolin 2g IV x 1 for pre-op prophylaxis     No data recorded.  No results for input(s): WBC, CREATININE, LATICACIDVEN, VANCOTROUGH, VANCOPEAK, VANCORANDOM, GENTTROUGH, GENTPEAK, GENTRANDOM, TOBRATROUGH, TOBRAPEAK, TOBRARND, AMIKACINPEAK, AMIKACINTROU, AMIKACIN in the last 168 hours.  Estimated Creatinine Clearance: 63.6 mL/min (by C-G formula based on SCr of 0.88 mg/dL).    Allergies  Allergen Reactions  . Motrin [Ibuprofen] Other (See Comments)    GI UPSET    Thank you for allowing pharmacy to be a part of this patient's care.  Thomasene Rippleavid Asbury Hair, PharmD, BCPS Clinical Pharmacist 08/22/2017

## 2017-08-22 NOTE — Anesthesia Postprocedure Evaluation (Signed)
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  Procedure(s) Performed: LUMBAR LAMINECTOMY/DECOMPRESSION MICRODISCECTOMY 2 LEVELS L4-S1 (Bilateral Spine Lumbar)  Patient location during evaluation: PACU Anesthesia Type: General Level of consciousness: awake and alert Pain management: pain level controlled Vital Signs Assessment: post-procedure vital signs reviewed and stable Respiratory status: spontaneous breathing, nonlabored ventilation, respiratory function stable and patient connected to nasal cannula oxygen Cardiovascular status: blood pressure returned to baseline and stable Postop Assessment: no apparent nausea or vomiting Anesthetic complications: no     Last Vitals:  Vitals:   08/22/17 1326 08/22/17 1341  BP: 116/63 (!) 123/56  Pulse: 96 92  Resp: 20 13  Temp:    SpO2: 93% 97%    Last Pain:  Vitals:   08/22/17 0845  TempSrc: Temporal  PainSc: 7                  Yevette EdwardsJames G Adams

## 2017-08-22 NOTE — Transfer of Care (Signed)
Immediate Anesthesia Transfer of Care Note  Patient: Lindsay BreedJacquelyn Budzynski  Procedure(s) Performed: LUMBAR LAMINECTOMY/DECOMPRESSION MICRODISCECTOMY 2 LEVELS L4-S1 (Bilateral Spine Lumbar)  Patient Location: PACU  Anesthesia Type:General  Level of Consciousness: awake, alert , oriented and patient cooperative  Airway & Oxygen Therapy: Patient Spontanous Breathing and Patient connected to face mask oxygen  Post-op Assessment: Report given to RN and Post -op Vital signs reviewed and stable  Post vital signs: Reviewed and stable  Last Vitals:  Vitals:   08/22/17 0845  BP: (!) 142/67  Pulse: 67  Resp: 18  Temp: 36.6 C  SpO2: 100%    Last Pain:  Vitals:   08/22/17 0845  TempSrc: Temporal  PainSc: 7          Complications: No apparent anesthesia complications

## 2017-08-22 NOTE — Progress Notes (Signed)
  History: Lindsay Donaldson is POD0 s/p lumbar decompression. Pain prior to surgery has resolved. Continues to have heaviness in left leg, but no pain. No other complaints at this time.   Physical Exam: Vitals:   08/22/17 1413 08/22/17 1458  BP: (!) 118/56 (!) 107/58  Pulse: 94 94  Resp: 16   Temp: (!) 97.4 F (36.3 C)   SpO2: 95% 95%    AA Ox3 CNI Skin: incision intact. Glue present. Strength:4/5 diffusely left leg, sensation intact and symmetric.   Data:  No results for input(s): NA, K, CL, CO2, BUN, CREATININE, LABGLOM, GLUCOSE, CALCIUM in the last 168 hours. No results for input(s): AST, ALT, ALKPHOS in the last 168 hours.  Invalid input(s): TBILI   No results for input(s): WBC, HGB, HCT, PLT in the last 168 hours. No results for input(s): APTT, INR in the last 168 hours.      Assessment/Plan:  Lindsay Donaldson was seen post operatively after lumbar decompression. Pain present prior to surgery has resolved. Continues to have left foot drop related to polio. Pain will continue to be controlled with tylenol, oxycodone, and robaxin. @ week follow up in clinic already scheduled.   Ivar DrapeAmanda Henretter Piekarski PA-C Department of Neurosurgery

## 2017-08-22 NOTE — Discharge Instructions (Signed)
Interscalene Nerve Block with Exparel  1.  For your surgery you have received an Interscalene Nerve Block with Exparel. 2. Nerve Blocks affect many types of nerves, including nerves that control movement, pain and normal sensation.  You may experience feelings such as numbness, tingling, heaviness, weakness or the inability to move your arm or the feeling or sensation that your arm has "fallen asleep". 3. A nerve block with Exparel can last up to 5 days.  Usually the weakness wears off first.  The tingling and heaviness usually wear off next.  Finally you may start to notice pain.  Keep in mind that this may occur in any order.  Once a nerve block starts to wear off it is usually completely gone within 60 minutes. 4. ISNB may cause mild shortness of breath, a hoarse voice, blurry vision, unequal pupils, or drooping of the face on the same side as the nerve block.  These symptoms will usually resolve with the numbness.  Very rarely the procedure itself can cause mild seizures. 5. If needed, your surgeon will give you a prescription for pain medication.  It will take about 60 minutes for the oral pain medication to become fully effective.  So, it is recommended that you start taking this medication before the nerve block first begins to wear off, or when you first begin to feel discomfort. 6. Take your pain medication only as prescribed.  Pain medication can cause sedation and decrease your breathing if you take more than you need for the level of pain that you have. 7. Nausea is a common side effect of many pain medications.  You may want to eat something before taking your pain medicine to prevent nausea. 8. After an Interscalene nerve block, you cannot feel pain, pressure or extremes in temperature in the effected arm.  Because your arm is numb it is at an increased risk for injury.  To decrease the possibility of injury, please practice the following:  a. While you are awake change the position of  your arm frequently to prevent too much pressure on any one area for prolonged periods of time. b.  If you have a cast or tight dressing, check the color or your fingers every couple of hours.  Call your surgeon with the appearance of any discoloration (white or blue). c. If you are given a sling to wear before you go home, please wear it  at all times until the block has completely worn off.  Do not get up at night without your sling. d. Please contact [facility] or your surgeon if you do not begin to regain sensation after 7 days from the surgery.  Anesthesia may be contacted by calling the Same Day Surgery Department, Mon. through Fri., 6 am to 4 pm at [phone].   e. If you experience any other problems or concerns, please contact your surgeon's office. If you experience severe or prolonged shortness of breath go to the nearest emergency department.   Interscalene Nerve Block with Exparel  9.  For your surgery you have received an Interscalene Nerve Block with Exparel. 10. Nerve Blocks affect many types of nerves, including nerves that control movement, pain and normal sensation.  You may experience feelings such as numbness, tingling, heaviness, weakness or the inability to move your arm or the feeling or sensation that your arm has "fallen asleep". 11. A nerve block with Exparel can last up to 5 days.  Usually the weakness wears off first.  The tingling  and heaviness usually wear off next.  Finally you may start to notice pain.  Keep in mind that this may occur in any order.  Once a nerve block starts to wear off it is usually completely gone within 60 minutes. 12. ISNB may cause mild shortness of breath, a hoarse voice, blurry vision, unequal pupils, or drooping of the face on the same side as the nerve block.  These symptoms will usually resolve with the numbness.  Very rarely the procedure itself can cause mild seizures. 13. If needed, your surgeon will give you a prescription for pain  medication.  It will take about 60 minutes for the oral pain medication to become fully effective.  So, it is recommended that you start taking this medication before the nerve block first begins to wear off, or when you first begin to feel discomfort. 14. Take your pain medication only as prescribed.  Pain medication can cause sedation and decrease your breathing if you take more than you need for the level of pain that you have. 15. Nausea is a common side effect of many pain medications.  You may want to eat something before taking your pain medicine to prevent nausea. 16. After an Interscalene nerve block, you cannot feel pain, pressure or extremes in temperature in the effected arm.  Because your arm is numb it is at an increased risk for injury.  To decrease the possibility of injury, please practice the following:  a. While you are awake change the position of your arm frequently to prevent too much pressure on any one area for prolonged periods of time. b.  If you have a cast or tight dressing, check the color or your fingers every couple of hours.  Call your surgeon with the appearance of any discoloration (white or blue). c. If you are given a sling to wear before you go home, please wear it  at all times until the block has completely worn off.  Do not get up at night without your sling. d. Please contact [facility] or your surgeon if you do not begin to regain sensation after 7 days from the surgery.  Anesthesia may be contacted by calling the Same Day Surgery Department, Mon. through Fri., 6 am to 4 pm at [phone].   e. If you experience any other problems or concerns, please contact your surgeon's office. If you experience severe or prolonged shortness of breath go to the nearest emergency department.AMBULATORY SURGERY  DISCHARGE INSTRUCTIONS   1) The drugs that you were given will stay in your system until tomorrow so for the next 24 hours you should not:  A) Drive an  automobile B) Make any legal decisions C) Drink any alcoholic beverage   2) You may resume regular meals tomorrow.  Today it is better to start with liquids and gradually work up to solid foods.  You may eat anything you prefer, but it is better to start with liquids, then soup and crackers, and gradually work up to solid foods.   3) Please notify your doctor immediately if you have any unusual bleeding, trouble breathing, redness and pain at the surgery site, drainage, fever, or pain not relieved by medication.    4) Additional Instructions:        Please contact your physician with any problems or Same Day Surgery at [phone], Monday through Friday 6 am to 4 pm, or [place] at [facility] number at [phone].AMBULATORY SURGERY  DISCHARGE INSTRUCTIONS   5) The  drugs that you were given will stay in your system until tomorrow so for the next 24 hours you should not:  D) Drive an automobile E) Make any legal decisions F) Drink any alcoholic beverage   6) You may resume regular meals tomorrow.  Today it is better to start with liquids and gradually work up to solid foods.  You may eat anything you prefer, but it is better to start with liquids, then soup and crackers, and gradually work up to solid foods.   7) Please notify your doctor immediately if you have any unusual bleeding, trouble breathing, redness and pain at the surgery site, drainage, fever, or pain not relieved by medication.    8) Additional Instructions:        Please contact your physician with any problems or Same Day Surgery at [phone], Monday through Friday 6 am to 4 pm, or [place] at [facility] number at [phone]. Your surgeon has performed an operation on your lumbar spine (low back) to relieve pressure on one or more nerves. Many times, patients feel better immediately after surgery and can overdo it. Even if you feel well, it is important that you follow these  activity guidelines. If you do not let your back heal properly from the surgery, you can increase the chance of a disc herniation and/or return of your symptoms. The following are instructions to help in your recovery once you have been discharged from the hospital.  * Do not take anti-inflammatory medications for 3 days after surgery (naproxen [Aleve], ibuprofen [Advil, Motrin], celecoxib [Celebrex], etc.)  Activity    No bending, lifting, or twisting (BLT). Avoid lifting objects heavier than 10 pounds (gallon milk jug).  Where possible, avoid household activities that involve lifting, bending, pushing, or pulling such as laundry, vacuuming, grocery shopping, and childcare. Try to arrange for help from friends and family for these activities while your back heals.  Increase physical activity slowly as tolerated.  Taking short walks is encouraged, but avoid strenuous exercise. Do not jog, run, bicycle, lift weights, or participate in any other exercises unless specifically allowed by your doctor. Avoid prolonged sitting, including car rides.  Talk to your doctor before resuming sexual activity.  You should not drive until cleared by your doctor.  Until released by your doctor, you should not return to work or school.  You should rest at home and let your body heal.   You may shower two days after your surgery.  After showering, lightly dab your incision dry. Do not take a tub bath or go swimming for 3 weeks, or until approved by your doctor at your follow-up appointment.  If you smoke, we strongly recommend that you quit.  Smoking has been proven to interfere with normal healing in your back and will dramatically reduce the success rate of your surgery. Please contact [facility] (800-QUIT-NOW) and use the resources at www.[facility].com for assistance in stopping smoking.  Surgical Incision   If you have a dressing on your incision, you may remove it three days after your surgery. Keep your  incision area clean and dry.  If you have staples or stitches on your incision, you should have a follow up scheduled for removal. If you do not have staples or stitches, you will have steri-strips (small pieces of surgical tape) or Dermabond glue. The steri-strips/glue should begin to peel away within about a week (it is fine if the steri-strips fall off before then). If the strips are still in place  one week after your surgery, you may gently remove them.  Diet            You may return to your usual diet. Be sure to stay hydrated.  When to Contact us  Although your surgery and recovery will likely be uneventful, you may have some residual numbness, aches, and pains in your back and/or legs. This is normal and should improve in the next few weeks.  However, should you experience any of the following, contact us immediately:  New numbness or weakness  Pain that is progressively getting worse, and is not relieved by your pain medications or rest  Bleeding, redness, swelling, pain, or drainage from surgical incision  Chills or flu-like symptoms  Fever greater than 101.0 F (38.3 C)  Problems with bowel or bladder functions  Difficulty breathing or shortness of breath  Warmth, tenderness, or swelling in your calf  Contact Information  During office hours (Monday-Friday 9 am to 5 pm), please call your physician at [phone]  After hours and weekends, please call the [facility] at [phone] and ask for the Neurosurgery Resident On Call   For a life-threatening emergency, call 911

## 2017-08-22 NOTE — Op Note (Signed)
Indications: Mrs. Lorin PicketScott is a 72 yo female who presented with lumbar stenosis causing neurogenic claudication.  After failing conservative management, she elected for surgical intervention.  Findings: severe stenosis at L4-5, moderate to severe at L5-S1  Preoperative Diagnosis: Lumbar Stenosis with neurogenic claudication Postoperative Diagnosis: same   EBL: 20 ml IVF: 500 ml Drains: none Disposition: Extubated and Stable to PACU Complications: none  No foley catheter was placed.   Preoperative Note:   Risks of surgery discussed include: infection, bleeding, stroke, coma, death, paralysis, CSF leak, nerve/spinal cord injury, numbness, tingling, weakness, complex regional pain syndrome, recurrent stenosis and/or disc herniation, vascular injury, development of instability, neck/back pain, need for further surgery, persistent symptoms, development of deformity, and the risks of anesthesia. She understood these risks and have agreed to proceed.  Operative Note:   1. L4-S1 lumbar decompression including central laminectomy and bilateral medial facetectomies including foraminotomies  The patient was then brought from the preoperative center with intravenous access established.  The patient underwent general anesthesia and endotracheal tube intubation, and was then rotated on the [place] rail top where all pressure points were appropriately padded.  The skin was then thoroughly cleansed.  Perioperative antibiotic prophylaxis was administered.  Sterile prep and drapes were then applied and a timeout was then observed.  C-arm was brought into the field under sterile conditions and under lateral visualization the L4-5 and L5-S1 interspaces were identified and marked.  The incision was marked on the left and injected with local anesthetic. Once this was complete a 4 cm incision was opened with the use of a #10 blade knife.    The metrx tubes were sequentially advanced and confirmed in position at  L4-5. An 18mm by 70mm tube was locked in place to the bed side attachment.  The microscope was then sterilely brought into the field and muscle creep was hemostased with a bipolar and resected with a pituitary rongeur.  A Bovie extender was then used to expose the spinous process and lamina.  Careful attention was placed to not violate the facet capsule. A 3 mm matchstick drill bit was then used to make a hemi-laminotomy trough until the ligamentum flavum was exposed.  This was extended to the base of the spinous process and to the contralateral side to remove all the central bone from each side.  Once this was complete and the underlying ligamentum flavum was visualized, it was dissected with a curette and resected with Kerrison rongeurs.  Extensive ligamentum hypertrophy was noted, requiring a substantial amount of time and care for removal.  The dura was identified and palpated. The kerrison rongeur was then used to remove the medial facet bilaterally until no compression was noted.  A balltip probe was used to confirm decompression of the left L5 nerve root.  Additional attention was paid to completion of the contralateral L4-5 foraminotomy until the right L5 nerve root was completely free.  Once this was complete, L4-5 central decompression including medial facetectomy and foraminotomy was confirmed and decompression on both sides was confirmed. No CSF leak was noted.  A Depo-Medrol soaked Gelfoam pledget was placed in the defect.  The wound was copiously irrigated. The tube system was then removed under microscopic visualization and hemostasis was obtained with a bipolar.  We then turned attention to the L5-S1 level. The metrx tubes were sequentially advanced and confirmed in position. An 18mm by 70mm tube was locked in place to the bed side attachment.  Fluoroscopy was then removed from the field.  The microscope was then sterilely brought into the field and muscle creep was hemostased with a bipolar  and resected with a pituitary rongeur.  A Bovie extender was then used to expose the spinous process and lamina.  Careful attention was placed to not violate the facet capsule. A 3 mm matchstick drill bit was then used to make a hemi-laminotomy trough until the ligamentum flavum was exposed.  This was extended to the base of the spinous process and to the contralateral side to remove all the central bone from each side.  Once this was complete and the underlying ligamentum flavum was visualized, it was dissected with a curette and resected with Kerrison rongeurs.  Extensive ligamentum hypertrophy was noted, requiring a substantial amount of time and care for removal.  The dura was identified and palpated. The kerrison rongeur was then used to remove the medial facet bilaterally until no compression was noted.  A balltip probe was used to confirm decompression of the left S1 nerve root.  Additional attention was paid to completion of the contralateral L5-S1 foraminotomy until the right S1 nerve root was completely free.  Once this was complete, L5-S1 central decompression including medial facetectomy and foraminotomy was confirmed and decompression on both sides was confirmed. No CSF leak was noted.  A Depo-Medrol soaked Gelfoam pledget was placed in the defect.  The wound was copiously irrigated. The tube system was then removed under microscopic visualization and hemostasis was obtained with a bipolar.  The fascial layer was reapproximated with the use of a 0 Vicryl suture.  Subcutaneous tissue layer was reapproximated using 2-0 Vicryl suture.  3-0 monocryl was placed in subcuticular fashion. The skin was then cleansed and Dermabond was used to close the skin opening.  Patient was then rotated back to the preoperative bed awakened from anesthesia and taken to recovery all counts are correct in this case.  I performed the entire procedure with the assistance of Ivar Drape PA as an Horticulturist, commercial.  Thurmon Mizell K. Myer Haff MD

## 2017-08-23 ENCOUNTER — Encounter: Payer: Self-pay | Admitting: Neurosurgery

## 2017-11-07 ENCOUNTER — Other Ambulatory Visit: Payer: Self-pay | Admitting: Family Medicine

## 2017-11-07 DIAGNOSIS — Z1231 Encounter for screening mammogram for malignant neoplasm of breast: Secondary | ICD-10-CM

## 2017-12-19 ENCOUNTER — Ambulatory Visit
Admission: RE | Admit: 2017-12-19 | Discharge: 2017-12-19 | Disposition: A | Discharge status: Home / Self Care | Payer: Medicare Other | Source: Ambulatory Visit | Attending: Family Medicine | Admitting: Family Medicine

## 2017-12-19 DIAGNOSIS — Z1231 Encounter for screening mammogram for malignant neoplasm of breast: Secondary | ICD-10-CM | POA: Insufficient documentation

## 2018-03-06 IMAGING — CR DG LUMBAR SPINE COMPLETE 4+V
4 series · 4 of 4 positions shown · non-contrast
Comparison: MRI of the lumbar spine December 19, 2016

CLINICAL DATA: History of lumbar spinal stenosis and neurogenic
claudication.

EXAM:
LUMBAR SPINE - COMPLETE 4+ VIEW

[l-spine ap]
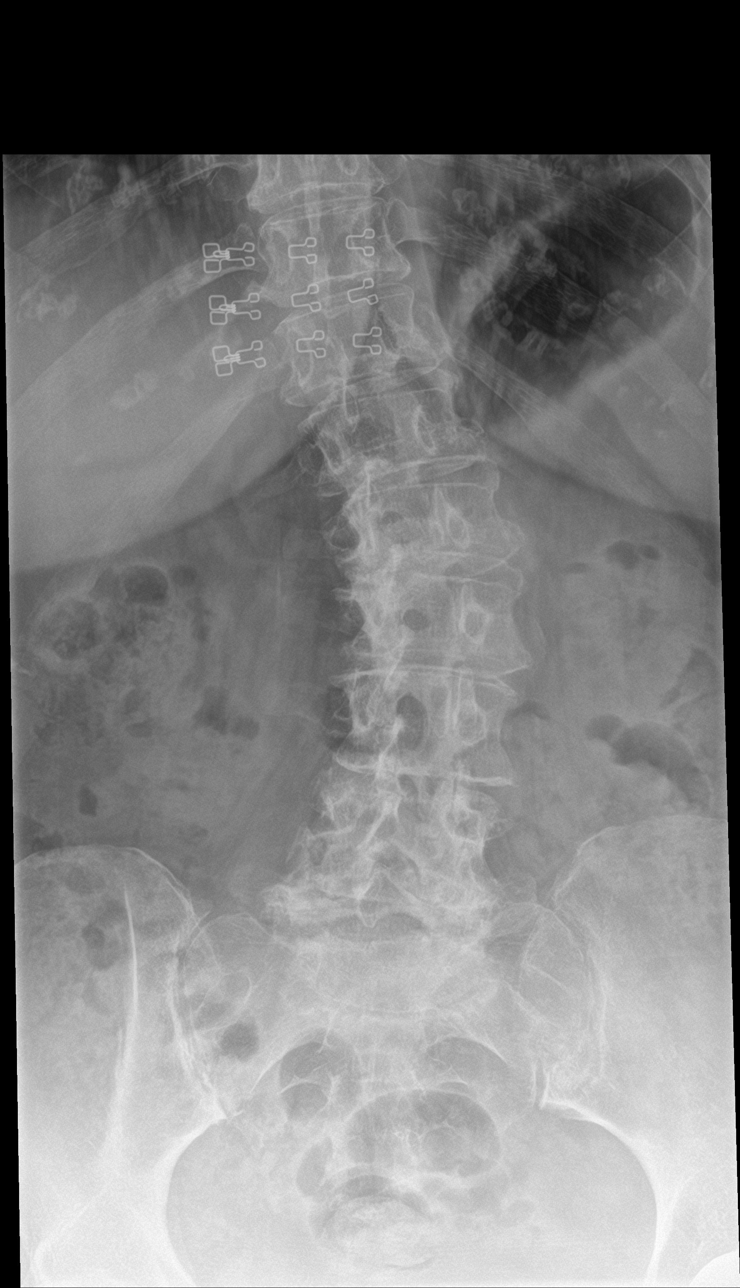

[l-spine obl]
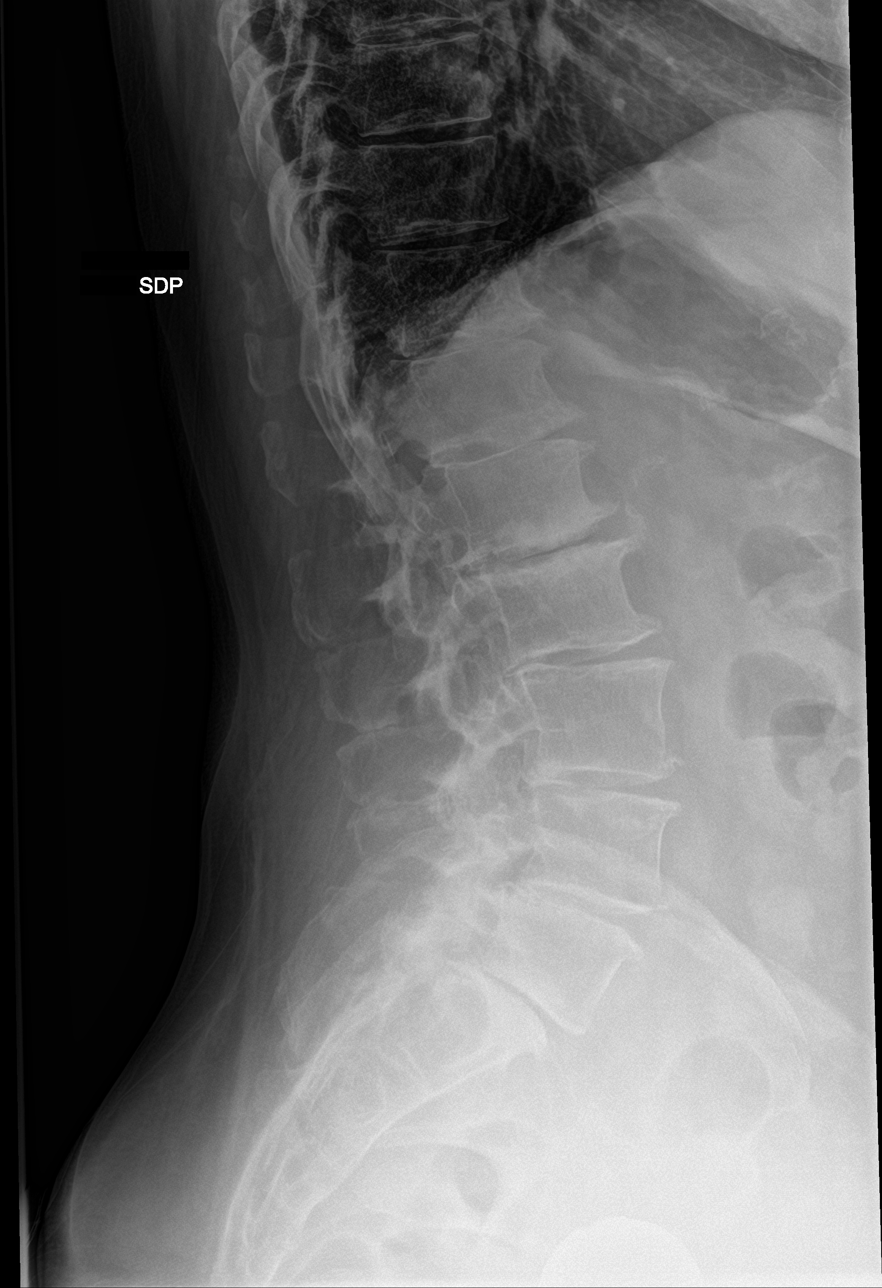

[l-spine lat]
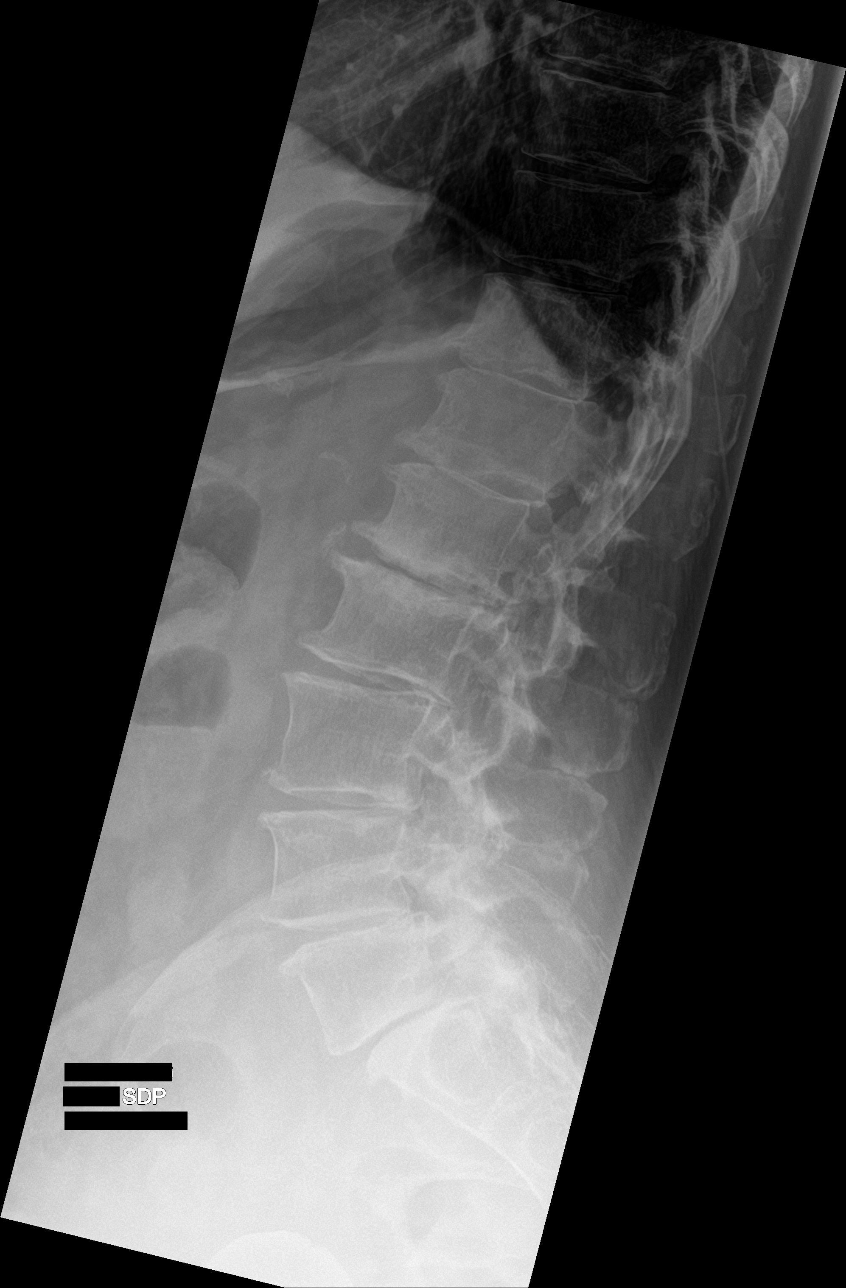

[l-spine spot]
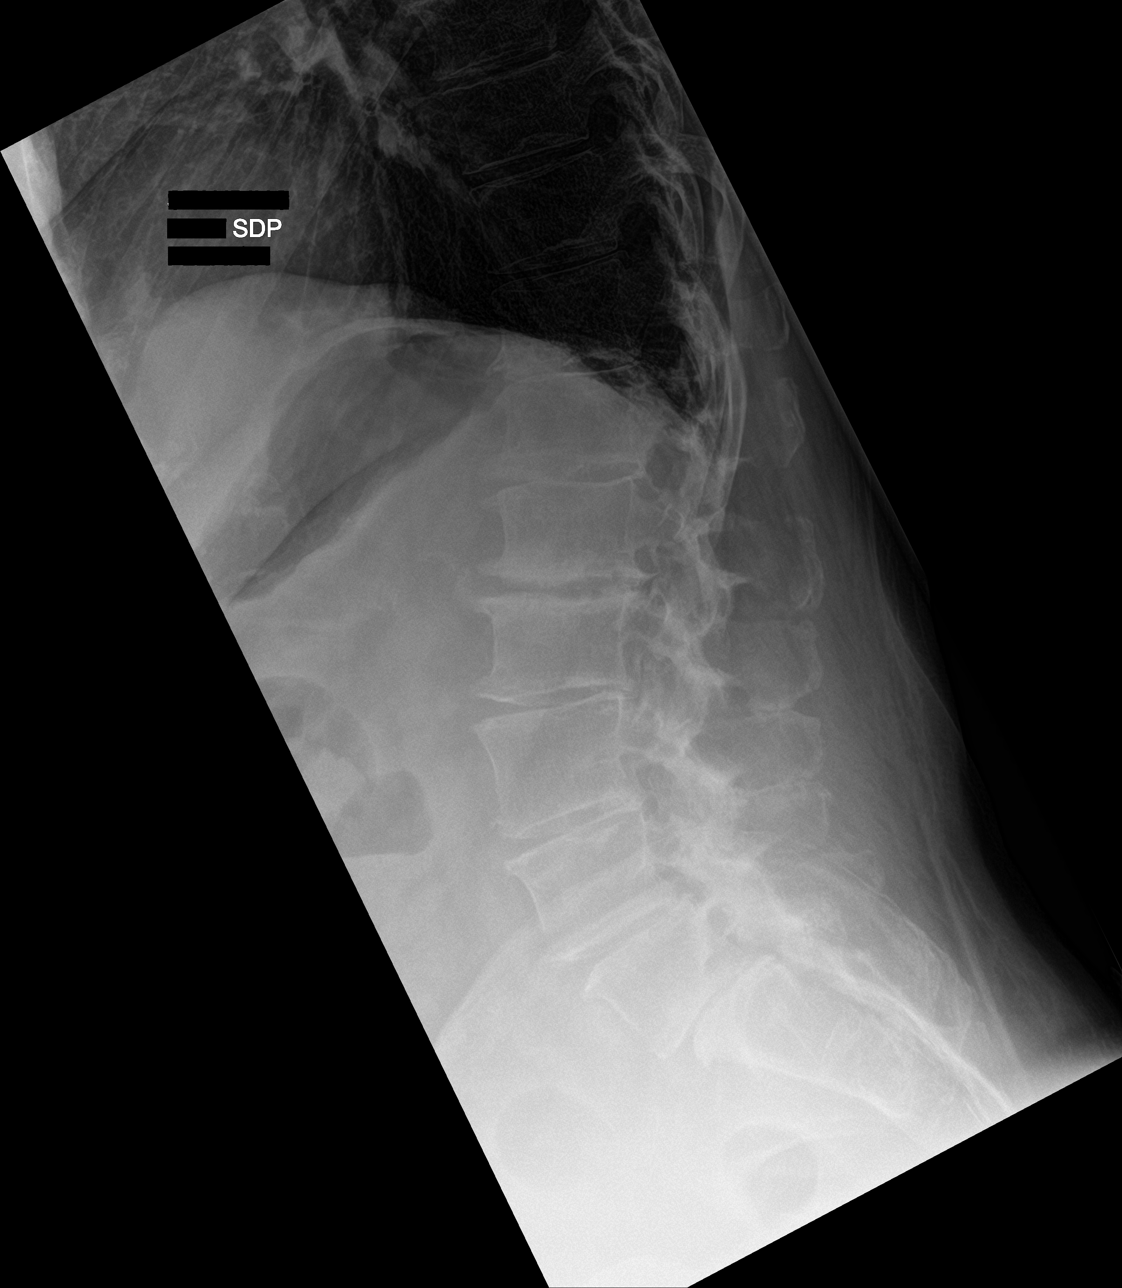

[4 of 4 positions shown; findings below may reference images not displayed]

FINDINGS: There is curvature of the lumbar spine centered at approximately L3
convex toward the left. The vertebral bodies are preserved in
height. There is grade 1 anterolisthesis of L5 with respect to S1.
As the patient moves from the neutral to the extended positions the
spondylolisthesis is stable. As the patient moves to the flexed
position the spondylolisthesis decreases slightly. There is
multilevel degenerative disc space narrowing. Anterior endplate
osteophytes are present at multiple levels.
IMPRESSION: Multilevel degenerative disc disease.  No compression fracture.

Grade 1 anterolisthesis of L5 with respect S1 which is seen to be
greatest in the neutral and extended positions and which decreases
slightly in the flexed position.
